# Patient Record
Sex: Female | Born: 1980 | Race: Black or African American | Hispanic: No | Marital: Single | Smoking: Never smoker
Health system: Southern US, Community
[De-identification: ages and names within clinical notes are randomized; demographics above are authoritative.]

## PROBLEM LIST (undated history)

## (undated) DIAGNOSIS — E119 Type 2 diabetes mellitus without complications: Secondary | ICD-10-CM

## (undated) DIAGNOSIS — G43909 Migraine, unspecified, not intractable, without status migrainosus: Secondary | ICD-10-CM

## (undated) DIAGNOSIS — M549 Dorsalgia, unspecified: Secondary | ICD-10-CM

---

## 2015-01-17 ENCOUNTER — Encounter (HOSPITAL_COMMUNITY): Payer: Self-pay | Admitting: Nurse Practitioner

## 2015-01-17 ENCOUNTER — Emergency Department (HOSPITAL_COMMUNITY)
Admission: EM | Admit: 2015-01-17 | Discharge: 2015-01-17 | Disposition: A | Discharge status: Home / Self Care | Payer: Medicaid Other | Attending: Emergency Medicine | Admitting: Emergency Medicine

## 2015-01-17 DIAGNOSIS — R2243 Localized swelling, mass and lump, lower limb, bilateral: Secondary | ICD-10-CM | POA: Insufficient documentation

## 2015-01-17 DIAGNOSIS — M545 Low back pain: Secondary | ICD-10-CM | POA: Diagnosis not present

## 2015-01-17 DIAGNOSIS — G43001 Migraine without aura, not intractable, with status migrainosus: Secondary | ICD-10-CM | POA: Diagnosis not present

## 2015-01-17 DIAGNOSIS — E119 Type 2 diabetes mellitus without complications: Secondary | ICD-10-CM | POA: Insufficient documentation

## 2015-01-17 DIAGNOSIS — G43909 Migraine, unspecified, not intractable, without status migrainosus: Secondary | ICD-10-CM | POA: Diagnosis present

## 2015-01-17 HISTORY — DX: Dorsalgia, unspecified: M54.9

## 2015-01-17 HISTORY — DX: Migraine, unspecified, not intractable, without status migrainosus: G43.909

## 2015-01-17 HISTORY — DX: Type 2 diabetes mellitus without complications: E11.9

## 2015-01-17 LAB — CBG MONITORING, ED: Glucose-Capillary: 167 mg/dL — ABNORMAL HIGH (ref 65–99)

## 2015-01-17 MED ORDER — NAPROXEN 500 MG PO TABS
500.0000 mg | ORAL_TABLET | Freq: Two times a day (BID) | ORAL | Status: AC | PRN
Start: 2015-01-17 — End: ?

## 2015-01-17 MED ORDER — KETOROLAC TROMETHAMINE 30 MG/ML IJ SOLN
30.0000 mg | Freq: Once | INTRAMUSCULAR | Status: AC
  Administered 2015-01-17: 30 mg via INTRAVENOUS
  Filled 2015-01-17: qty 1

## 2015-01-17 MED ORDER — METOCLOPRAMIDE HCL 5 MG/ML IJ SOLN
10.0000 mg | Freq: Once | INTRAMUSCULAR | Status: AC
  Administered 2015-01-17: 10 mg via INTRAVENOUS
  Filled 2015-01-17: qty 2

## 2015-01-17 MED ORDER — SODIUM CHLORIDE 0.9 % IV BOLUS (SEPSIS)
1000.0000 mL | Freq: Once | INTRAVENOUS | Status: AC
  Administered 2015-01-17: 1000 mL via INTRAVENOUS

## 2015-01-17 MED ORDER — DIPHENHYDRAMINE HCL 50 MG/ML IJ SOLN
25.0000 mg | Freq: Once | INTRAMUSCULAR | Status: AC
  Administered 2015-01-17: 25 mg via INTRAVENOUS
  Filled 2015-01-17: qty 1

## 2015-01-17 NOTE — ED Notes (Signed)
Pt requesting CBG check. 

## 2015-01-17 NOTE — ED Notes (Signed)
Pt requesting prescriptions for home, requests "percocet and if I can't have that, then lortab and if I can't have that then tylenol 3 with codeine."

## 2015-01-17 NOTE — ED Notes (Signed)
Pt verbalizes understanding of d/c instructions and denies any further needs at this time. 

## 2015-01-17 NOTE — ED Notes (Signed)
She states "im having many problems." she c/o migraine x 2 days, she took percocet and ibuprofen with no relief. She noticed both feet were swollen 3 days ago, she denies sob/cp/swelling in the rest of her body.  She also c/o lower back pain that is chronic but worse this week. Ambulatory, mae, breathing easily

## 2015-01-17 NOTE — Discharge Instructions (Signed)

## 2015-01-17 NOTE — ED Provider Notes (Signed)
CSN: [id number]     Arrival date & time 01/17/15  1522 History   First MD Initiated Contact with Patient 01/17/15 1548     Chief Complaint  Patient presents with  . Back Pain  . Migraine  . Foot Swelling     (Consider location/radiation/quality/duration/timing/severity/associated sxs/prior Treatment) Patient is a 34 y.o. female presenting with migraines. The history is provided by the patient.  Migraine This is a new problem. The current episode started 12 to 24 hours ago. The problem occurs constantly. The problem has not changed since onset.Nothing aggravates the symptoms. Nothing relieves the symptoms. She has tried nothing for the symptoms. The treatment provided no relief.    Past Medical History  Diagnosis Date  . Back pain   . Diabetes mellitus without complication   . Migraine    History reviewed. No pertinent past surgical history. History reviewed. No pertinent family history. History  Substance Use Topics  . Smoking status: Never Smoker   . Smokeless tobacco: Not on file  . Alcohol Use: No   OB History    No data available     Review of Systems  All other systems reviewed and are negative.     Allergies  Aspirin; Ivp dye; and Tramadol  Home Medications   Prior to Admission medications   Medication Sig Start Date End Date Taking? Authorizing Provider  naproxen (NAPROSYN) 500 MG tablet Take 1 tablet (500 mg total) by mouth 2 (two) times daily as needed for moderate pain. 01/17/15   Lyndal Pulley, MD   BP 130/78 mmHg  Pulse 101  Temp(Src) 97.9 F (36.6 C) (Oral)  Resp 20  SpO2 97% Physical Exam  Constitutional: She is oriented to person, place, and time. She appears well-developed and well-nourished. No distress.  HENT:  Head: Normocephalic.  Eyes: Conjunctivae are normal.  Neck: Neck supple. No tracheal deviation present.  Cardiovascular: Normal rate and regular rhythm.   Pulmonary/Chest: Effort normal. No respiratory distress.  Abdominal: Soft.  She exhibits no distension.  Neurological: She is alert and oriented to person, place, and time. She has normal strength. No cranial nerve deficit. Coordination normal. GCS eye subscore is 4. GCS verbal subscore is 5. GCS motor subscore is 6.  Skin: Skin is warm and dry.  Psychiatric: She has a normal mood and affect.    ED Course  Procedures (including critical care time) Labs Review Labs Reviewed - No data to display  Imaging Review No results found.   EKG Interpretation None      MDM   Final diagnoses:  Migraine without aura and with status migrainosus, not intractable    34 year old feel presents with gradual onset headache starting last night. She has had multiple previous episodes of this that resolved spontaneously. The last one that she had was roughly a month ago which resolved following Percocet and Tylenol. I discussed the dangers of mixing Tylenol containing compounds. She has not taken anything today for her symptoms. She did not sleep well last night and woke up today with a backache that appears to be muscular skeletal in nature. She is also incidentally noticed bilateral foot swelling that is nonpitting and at this time does not appear to be a related complaint. Plan for supportive care measures for the headache. No indication for further emergent workup at this time. Plan to follow up with PCP as needed for routine health maintenance and return precautions discussed for worsening or new concerning symptoms.     Lyndal Pulley, MD 01/17/15  1643 

## 2015-01-18 ENCOUNTER — Emergency Department (HOSPITAL_COMMUNITY): Payer: Medicaid Other

## 2015-01-18 ENCOUNTER — Emergency Department (HOSPITAL_COMMUNITY)
Admission: EM | Admit: 2015-01-18 | Discharge: 2015-01-18 | Disposition: A | Discharge status: Home / Self Care | Payer: Medicaid Other | Attending: Emergency Medicine | Admitting: Emergency Medicine

## 2015-01-18 ENCOUNTER — Encounter (HOSPITAL_COMMUNITY): Payer: Self-pay | Admitting: Emergency Medicine

## 2015-01-18 DIAGNOSIS — R600 Localized edema: Secondary | ICD-10-CM | POA: Diagnosis not present

## 2015-01-18 DIAGNOSIS — R609 Edema, unspecified: Secondary | ICD-10-CM

## 2015-01-18 DIAGNOSIS — H538 Other visual disturbances: Secondary | ICD-10-CM | POA: Diagnosis not present

## 2015-01-18 DIAGNOSIS — E119 Type 2 diabetes mellitus without complications: Secondary | ICD-10-CM | POA: Diagnosis not present

## 2015-01-18 DIAGNOSIS — G43909 Migraine, unspecified, not intractable, without status migrainosus: Secondary | ICD-10-CM | POA: Diagnosis not present

## 2015-01-18 DIAGNOSIS — Z3202 Encounter for pregnancy test, result negative: Secondary | ICD-10-CM | POA: Diagnosis not present

## 2015-01-18 DIAGNOSIS — Z79899 Other long term (current) drug therapy: Secondary | ICD-10-CM | POA: Diagnosis not present

## 2015-01-18 DIAGNOSIS — R2243 Localized swelling, mass and lump, lower limb, bilateral: Secondary | ICD-10-CM | POA: Diagnosis present

## 2015-01-18 LAB — HEPATIC FUNCTION PANEL
ALBUMIN: 4.1 g/dL (ref 3.5–5.0)
ALT: 25 U/L (ref 14–54)
AST: 21 U/L (ref 15–41)
Alkaline Phosphatase: 75 U/L (ref 38–126)
BILIRUBIN TOTAL: 0.1 mg/dL — AB (ref 0.3–1.2)
Bilirubin, Direct: <0.1 mg/dL — ABNORMAL LOW (ref 0.1–0.5)
Total Protein: 7.5 g/dL (ref 6.5–8.1)

## 2015-01-18 LAB — URINALYSIS, ROUTINE W REFLEX MICROSCOPIC
Bilirubin Urine: NEGATIVE
Glucose, UA: NEGATIVE mg/dL
Hgb urine dipstick: NEGATIVE
Ketones, ur: NEGATIVE mg/dL
Nitrite: NEGATIVE
Protein, ur: NEGATIVE mg/dL
Specific Gravity, Urine: 1.033 — ABNORMAL HIGH (ref 1.005–1.030)
Urobilinogen, UA: 0.2 mg/dL (ref 0.0–1.0)
pH: 5.5 (ref 5.0–8.0)

## 2015-01-18 LAB — CBC
HCT: 35.7 % — ABNORMAL LOW (ref 36.0–46.0)
Hemoglobin: 12 g/dL (ref 12.0–15.0)
MCH: 28.3 pg (ref 26.0–34.0)
MCHC: 33.6 g/dL (ref 30.0–36.0)
MCV: 84.2 fL (ref 78.0–100.0)
Platelets: 265 10*3/uL (ref 150–400)
RBC: 4.24 MIL/uL (ref 3.87–5.11)
RDW: 12.4 % (ref 11.5–15.5)
WBC: 7.4 10*3/uL (ref 4.0–10.5)

## 2015-01-18 LAB — BASIC METABOLIC PANEL
ANION GAP: 10 (ref 5–15)
BUN: 9 mg/dL (ref 6–20)
CHLORIDE: 104 mmol/L (ref 101–111)
CO2: 25 mmol/L (ref 22–32)
CREATININE: 0.62 mg/dL (ref 0.44–1.00)
Calcium: 8.9 mg/dL (ref 8.9–10.3)
GFR calc Af Amer: >60 mL/min (ref >60)
GFR calc non Af Amer: >60 mL/min (ref >60)
Glucose, Bld: 191 mg/dL — ABNORMAL HIGH (ref 65–99)
Potassium: 4 mmol/L (ref 3.5–5.1)
Sodium: 139 mmol/L (ref 135–145)

## 2015-01-18 LAB — URINE MICROSCOPIC-ADD ON

## 2015-01-18 LAB — BRAIN NATRIURETIC PEPTIDE: B Natriuretic Peptide: 41.3 pg/mL (ref 0.0–100.0)

## 2015-01-18 LAB — CBG MONITORING, ED: Glucose-Capillary: 218 mg/dL — ABNORMAL HIGH (ref 65–99)

## 2015-01-18 LAB — POC URINE PREG, ED: PREG TEST UR: NEGATIVE

## 2015-01-18 MED ORDER — ACETAMINOPHEN 325 MG PO TABS
650.0000 mg | ORAL_TABLET | Freq: Once | ORAL | Status: AC
  Administered 2015-01-18: 650 mg via ORAL
  Filled 2015-01-18: qty 2

## 2015-01-18 MED ORDER — KETOROLAC TROMETHAMINE 30 MG/ML IJ SOLN
15.0000 mg | Freq: Once | INTRAMUSCULAR | Status: AC
  Administered 2015-01-18: 15 mg via INTRAVENOUS
  Filled 2015-01-18: qty 1

## 2015-01-18 NOTE — ED Provider Notes (Signed)
CSN: [id number]     Arrival date & time 01/18/15  1644 History   First MD Initiated Contact with Patient 01/18/15 1917     Chief Complaint  Patient presents with  . Foot Swelling  . Blood Sugar Problem  . Blurred Vision     (Consider location/radiation/quality/duration/timing/severity/associated sxs/prior Treatment) Patient is a 34 y.o. female presenting with leg pain. The history is provided by the patient.  Leg Pain Location:  Leg Time since incident:  1 week Leg location:  L lower leg and R lower leg Pain details:    Quality:  Aching   Radiates to:  Does not radiate   Severity:  Moderate   Onset quality:  Sudden   Duration:  2 days   Timing:  Constant   Progression:  Worsening Chronicity:  New Dislocation: no   Prior injury to area:  No Relieved by:  Nothing Worsened by:  Bearing weight Ineffective treatments:  None tried Associated symptoms: swelling   Associated symptoms: no decreased ROM, no fatigue and no fever     34 yo F with chief complaints of bilateral lower extremity swelling and pain. Patient states has been going on for the past couple weeks. Has gotten worse over the past 24 hours. Type I diabetic has not checked her blood sugars in the past couple weeks. Patient has fevers chills denies shortness of breath denies orthopnea PND.   Past Medical History  Diagnosis Date  . Back pain   . Diabetes mellitus without complication   . Migraine    History reviewed. No pertinent past surgical history. No family history on file. History  Substance Use Topics  . Smoking status: Never Smoker   . Smokeless tobacco: Not on file  . Alcohol Use: No   OB History    No data available     Review of Systems  Constitutional: Negative for fever, chills and fatigue.  HENT: Negative for congestion and rhinorrhea.   Eyes: Positive for visual disturbance. Negative for redness.  Respiratory: Negative for shortness of breath and wheezing.   Cardiovascular: Negative for  chest pain and palpitations.  Gastrointestinal: Negative for nausea and vomiting.  Genitourinary: Negative for dysuria and urgency.  Musculoskeletal: Negative for myalgias and arthralgias.  Skin: Negative for pallor and wound.  Neurological: Positive for headaches. Negative for dizziness.      Allergies  Aspirin; Ivp dye; Tramadol; and Ibuprofen  Home Medications   Prior to Admission medications   Medication Sig Start Date End Date Taking? Authorizing Provider  acetaminophen (TYLENOL) 500 MG tablet Take 1,000 mg by mouth every 6 (six) hours as needed for mild pain or headache.   Yes Historical Provider, MD  clonazePAM (KLONOPIN) 1 MG tablet Take 1 mg by mouth daily.   Yes Historical Provider, MD  oxyCODONE-acetaminophen (PERCOCET) 7.5-325 MG per tablet Take 1-2 tablets by mouth every 6 (six) hours as needed for moderate pain.   Yes Historical Provider, MD  PARoxetine (PAXIL) 20 MG tablet Take 20 mg by mouth daily.   Yes Historical Provider, MD  zolpidem (AMBIEN) 10 MG tablet Take 10 mg by mouth at bedtime as needed for sleep.   Yes Historical Provider, MD  naproxen (NAPROSYN) 500 MG tablet Take 1 tablet (500 mg total) by mouth 2 (two) times daily as needed for moderate pain. Patient not taking: Reported on 01/18/2015 01/17/15   Lyndal Pulley, MD   BP 132/88 mmHg  Pulse 86  Temp(Src) 98.4 F (36.9 C) (Oral)  Resp 18  Wt 262 lb (118.842 kg)  SpO2 100%  LMP 01/11/2015 Physical Exam  Constitutional: She is oriented to person, place, and time. She appears well-developed and well-nourished. No distress.  HENT:  Head: Normocephalic and atraumatic.  Eyes: EOM are normal. Pupils are equal, round, and reactive to light.  Neck: Normal range of motion. Neck supple.  Cardiovascular: Normal rate and regular rhythm.  Exam reveals no gallop and no friction rub.   No murmur heard. Pulmonary/Chest: Effort normal. She has no wheezes. She has no rales.  Abdominal: Soft. She exhibits no distension.  There is no tenderness. There is no rebound and no guarding.  Musculoskeletal: She exhibits edema (1+ to the shins). She exhibits no tenderness.  Neurological: She is alert and oriented to person, place, and time.  Skin: Skin is warm and dry. She is not diaphoretic.  Psychiatric: She has a normal mood and affect. Her behavior is normal.    ED Course  Procedures (including critical care time) Labs Review Labs Reviewed  BASIC METABOLIC PANEL - Abnormal; Notable for the following:    Glucose, Bld 191 (*)    All other components within normal limits  CBC - Abnormal; Notable for the following:    HCT 35.7 (*)    All other components within normal limits  URINALYSIS, ROUTINE W REFLEX MICROSCOPIC (NOT AT [facility]) - Abnormal; Notable for the following:    Color, Urine AMBER (*)    APPearance CLOUDY (*)    Specific Gravity, Urine 1.033 (*)    Leukocytes, UA SMALL (*)    All other components within normal limits  HEPATIC FUNCTION PANEL - Abnormal; Notable for the following:    Total Bilirubin 0.1 (*)    Bilirubin, Direct <0.1 (*)    All other components within normal limits  URINE MICROSCOPIC-ADD ON - Abnormal; Notable for the following:    Squamous Epithelial / LPF MANY (*)    All other components within normal limits  CBG MONITORING, ED - Abnormal; Notable for the following:    Glucose-Capillary 218 (*)    All other components within normal limits  BRAIN NATRIURETIC PEPTIDE  CBG MONITORING, ED  POC URINE PREG, ED    Imaging Review Dg Chest 2 View  01/18/2015   CLINICAL DATA:  Blurred vision and headaches for 2 days.  EXAM: CHEST  2 VIEW  COMPARISON:  None.  FINDINGS: The heart size and mediastinal contours are within normal limits. Both lungs are clear. The visualized skeletal structures are unremarkable.  IMPRESSION: Normal chest x-ray.   Electronically Signed   By: Rudie Meyer M.D.   On: 01/18/2015 19:58     EKG Interpretation None      MDM   Final diagnoses:  Peripheral  edema    34 yo F well-appearing and nontoxic. Patient with mild swelling to her lower extremity's. Will check kidney liver function as well as BNP. Obtain chest x-ray  Chest x-ray unremarkable BMP unremarkable. Liver and renal function appeared normal. Discussed results with patient will have her follow-up with her PCP. Patient requesting narcotic pain medicine for her leg pain discussed risks and benefits patient agrees to try Motrin naproxen follow-up with [facility].  The patients results and plan were reviewed and discussed.   Any x-rays performed were independently reviewed by myself.   Differential diagnosis were considered with the presenting HPI.  Medications  ketorolac (TORADOL) 30 MG/ML injection 15 mg (15 mg Intravenous Given 01/18/15 1956)  acetaminophen (TYLENOL) tablet 650 mg (650 mg Oral  Given 01/18/15 2118)    Filed Vitals:   01/18/15 1649 01/18/15 1709 01/18/15 2002  BP: 121/76  132/88  Pulse: 88  86  Temp: 98.4 F (36.9 C)    TempSrc: Oral    Resp: 18  18  Weight: 262 lb (118.842 kg)    SpO2: 98% 100% 100%    Final diagnoses:  Peripheral edema    Admission/ observation were discussed with the admitting physician, patient and/or family and they are comfortable with the plan.    Melene Plan, DO 01/18/15 2345

## 2015-01-18 NOTE — Discharge Instructions (Signed)
Peripheral Edema °You have swelling in your legs (peripheral edema). This swelling is due to excess accumulation of salt and water in your body. Edema may be a sign of heart, kidney or liver disease, or a side effect of a medication. It may also be due to problems in the leg veins. Elevating your legs and using special support stockings may be very helpful, if the cause of the swelling is due to poor venous circulation. Avoid long periods of standing, whatever the cause. °Treatment of edema depends on identifying the cause. Chips, pretzels, pickles and other salty foods should be avoided. Restricting salt in your diet is almost always needed. Water pills (diuretics) are often used to remove the excess salt and water from your body via urine. These medicines prevent the kidney from reabsorbing sodium. This increases urine flow. °Diuretic treatment may also result in lowering of potassium levels in your body. Potassium supplements may be needed if you have to use diuretics daily. Daily weights can help you keep track of your progress in clearing your edema. You should call your caregiver for follow up care as recommended. °SEEK IMMEDIATE MEDICAL CARE IF:  °· You have increased swelling, pain, redness, or heat in your legs. °· You develop shortness of breath, especially when lying down. °· You develop chest or abdominal pain, weakness, or fainting. °· You have a fever. °Document Released: 07/12/2004 Document Revised: 08/27/2011 Document Reviewed: 06/22/2009 °[employer]® Patient Information ©2015 [employer]. This information is not intended to replace advice given to you by your health care provider. Make sure you discuss any questions you have with your health care provider. ° °

## 2015-01-18 NOTE — ED Notes (Addendum)
Patient states she has blurred vision  and headache x 2 days.  She states it hurts when she walks.  Patient states she is a diabetic but doesn't check her glucose.  No LOC.  She has some nausea but no vomiting.   Headache is a pounding pressure.  Patient stated her bilaterally feet swollen has been ongoing for a couple of weeks.

## 2015-01-26 ENCOUNTER — Emergency Department (HOSPITAL_COMMUNITY)
Admission: EM | Admit: 2015-01-26 | Discharge: 2015-01-26 | Disposition: A | Discharge status: Home / Self Care | Payer: Medicaid Other | Attending: Emergency Medicine | Admitting: Emergency Medicine

## 2015-01-26 ENCOUNTER — Emergency Department (HOSPITAL_COMMUNITY): Payer: Medicaid Other

## 2015-01-26 ENCOUNTER — Encounter (HOSPITAL_COMMUNITY): Payer: Self-pay | Admitting: Emergency Medicine

## 2015-01-26 DIAGNOSIS — E1165 Type 2 diabetes mellitus with hyperglycemia: Secondary | ICD-10-CM | POA: Diagnosis not present

## 2015-01-26 DIAGNOSIS — G43909 Migraine, unspecified, not intractable, without status migrainosus: Secondary | ICD-10-CM | POA: Diagnosis not present

## 2015-01-26 DIAGNOSIS — Z3202 Encounter for pregnancy test, result negative: Secondary | ICD-10-CM | POA: Insufficient documentation

## 2015-01-26 DIAGNOSIS — M545 Low back pain: Secondary | ICD-10-CM

## 2015-01-26 DIAGNOSIS — Z79899 Other long term (current) drug therapy: Secondary | ICD-10-CM | POA: Diagnosis not present

## 2015-01-26 DIAGNOSIS — Y9389 Activity, other specified: Secondary | ICD-10-CM | POA: Insufficient documentation

## 2015-01-26 DIAGNOSIS — Y92410 Unspecified street and highway as the place of occurrence of the external cause: Secondary | ICD-10-CM | POA: Insufficient documentation

## 2015-01-26 DIAGNOSIS — S3992XA Unspecified injury of lower back, initial encounter: Secondary | ICD-10-CM | POA: Diagnosis not present

## 2015-01-26 DIAGNOSIS — Y998 Other external cause status: Secondary | ICD-10-CM | POA: Diagnosis not present

## 2015-01-26 LAB — RAPID URINE DRUG SCREEN, HOSP PERFORMED
AMPHETAMINES: NOT DETECTED
BARBITURATES: NOT DETECTED
BENZODIAZEPINES: NOT DETECTED
COCAINE: NOT DETECTED
OPIATES: NOT DETECTED
Tetrahydrocannabinol: NOT DETECTED

## 2015-01-26 LAB — CBG MONITORING, ED: Glucose-Capillary: 228 mg/dL — ABNORMAL HIGH (ref 65–99)

## 2015-01-26 LAB — URINALYSIS, ROUTINE W REFLEX MICROSCOPIC
BILIRUBIN URINE: NEGATIVE
Glucose, UA: NEGATIVE mg/dL
HGB URINE DIPSTICK: NEGATIVE
Ketones, ur: NEGATIVE mg/dL
Nitrite: NEGATIVE
Protein, ur: 30 mg/dL — AB
SPECIFIC GRAVITY, URINE: 1.024 (ref 1.005–1.030)
Urobilinogen, UA: 1 mg/dL (ref 0.0–1.0)
pH: 5 (ref 5.0–8.0)

## 2015-01-26 LAB — POC URINE PREG, ED: PREG TEST UR: NEGATIVE

## 2015-01-26 LAB — I-STAT CHEM 8, ED
BUN: 10 mg/dL (ref 6–20)
CREATININE: 0.8 mg/dL (ref 0.44–1.00)
Calcium, Ion: 1.18 mmol/L (ref 1.12–1.23)
Chloride: 99 mmol/L — ABNORMAL LOW (ref 101–111)
Glucose, Bld: 158 mg/dL — ABNORMAL HIGH (ref 65–99)
HEMATOCRIT: 37 % (ref 36.0–46.0)
Hemoglobin: 12.6 g/dL (ref 12.0–15.0)
POTASSIUM: 3.9 mmol/L (ref 3.5–5.1)
SODIUM: 136 mmol/L (ref 135–145)
TCO2: 24 mmol/L (ref 0–100)

## 2015-01-26 LAB — URINE MICROSCOPIC-ADD ON

## 2015-01-26 MED ORDER — OXYCODONE-ACETAMINOPHEN 5-325 MG PO TABS
2.0000 | ORAL_TABLET | Freq: Once | ORAL | Status: AC
  Administered 2015-01-26: 2 via ORAL
  Filled 2015-01-26: qty 2

## 2015-01-26 MED ORDER — OXYCODONE-ACETAMINOPHEN 5-325 MG PO TABS: 2.0000 | ORAL_TABLET | Freq: Once | ORAL | Status: AC

## 2015-01-26 MED ORDER — CYCLOBENZAPRINE HCL 10 MG PO TABS: 10.0000 mg | ORAL_TABLET | Freq: Three times a day (TID) | ORAL | Status: AC | PRN

## 2015-01-26 NOTE — Discharge Instructions (Signed)

## 2015-01-26 NOTE — ED Notes (Signed)
Pt returned from scans. Monitored by pulse ox and bp cuff. 

## 2015-01-26 NOTE — ED Notes (Signed)
MD made aware pt is requesting pain medication.  
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
## 2015-01-26 NOTE — ED Notes (Signed)
Pt. Stated, Im out of all my Insulin, I just moved here.
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
## 2015-02-07 ENCOUNTER — Ambulatory Visit: Payer: Medicaid Other | Admitting: Family Medicine

## 2016-12-09 IMAGING — CR DG CHEST 2V
2 series · 2 of 2 positions shown · non-contrast
Comparison: None.

CLINICAL DATA: Blurred vision and headaches for 2 days.

EXAM:
CHEST  2 VIEW

[w chest pa]
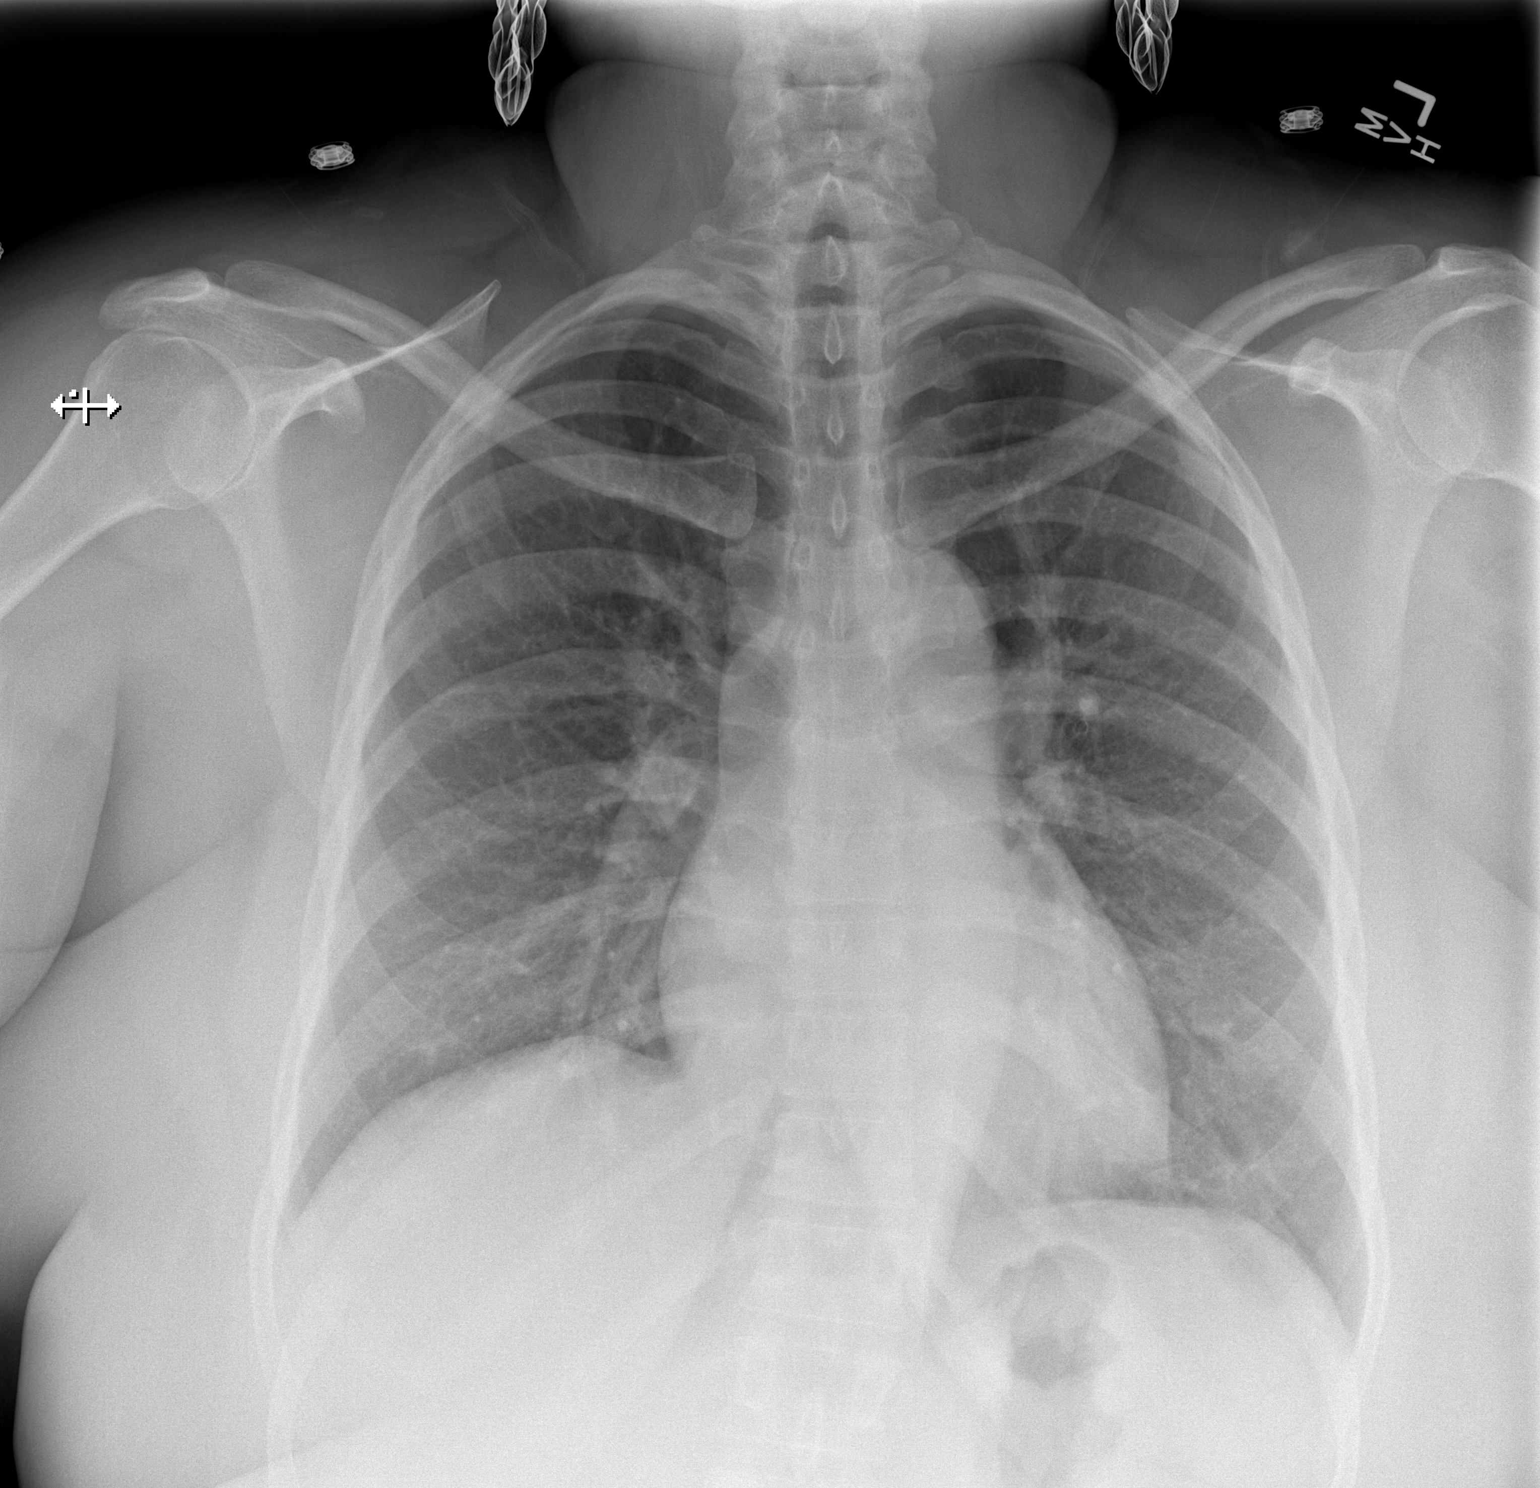

[w chest lat]
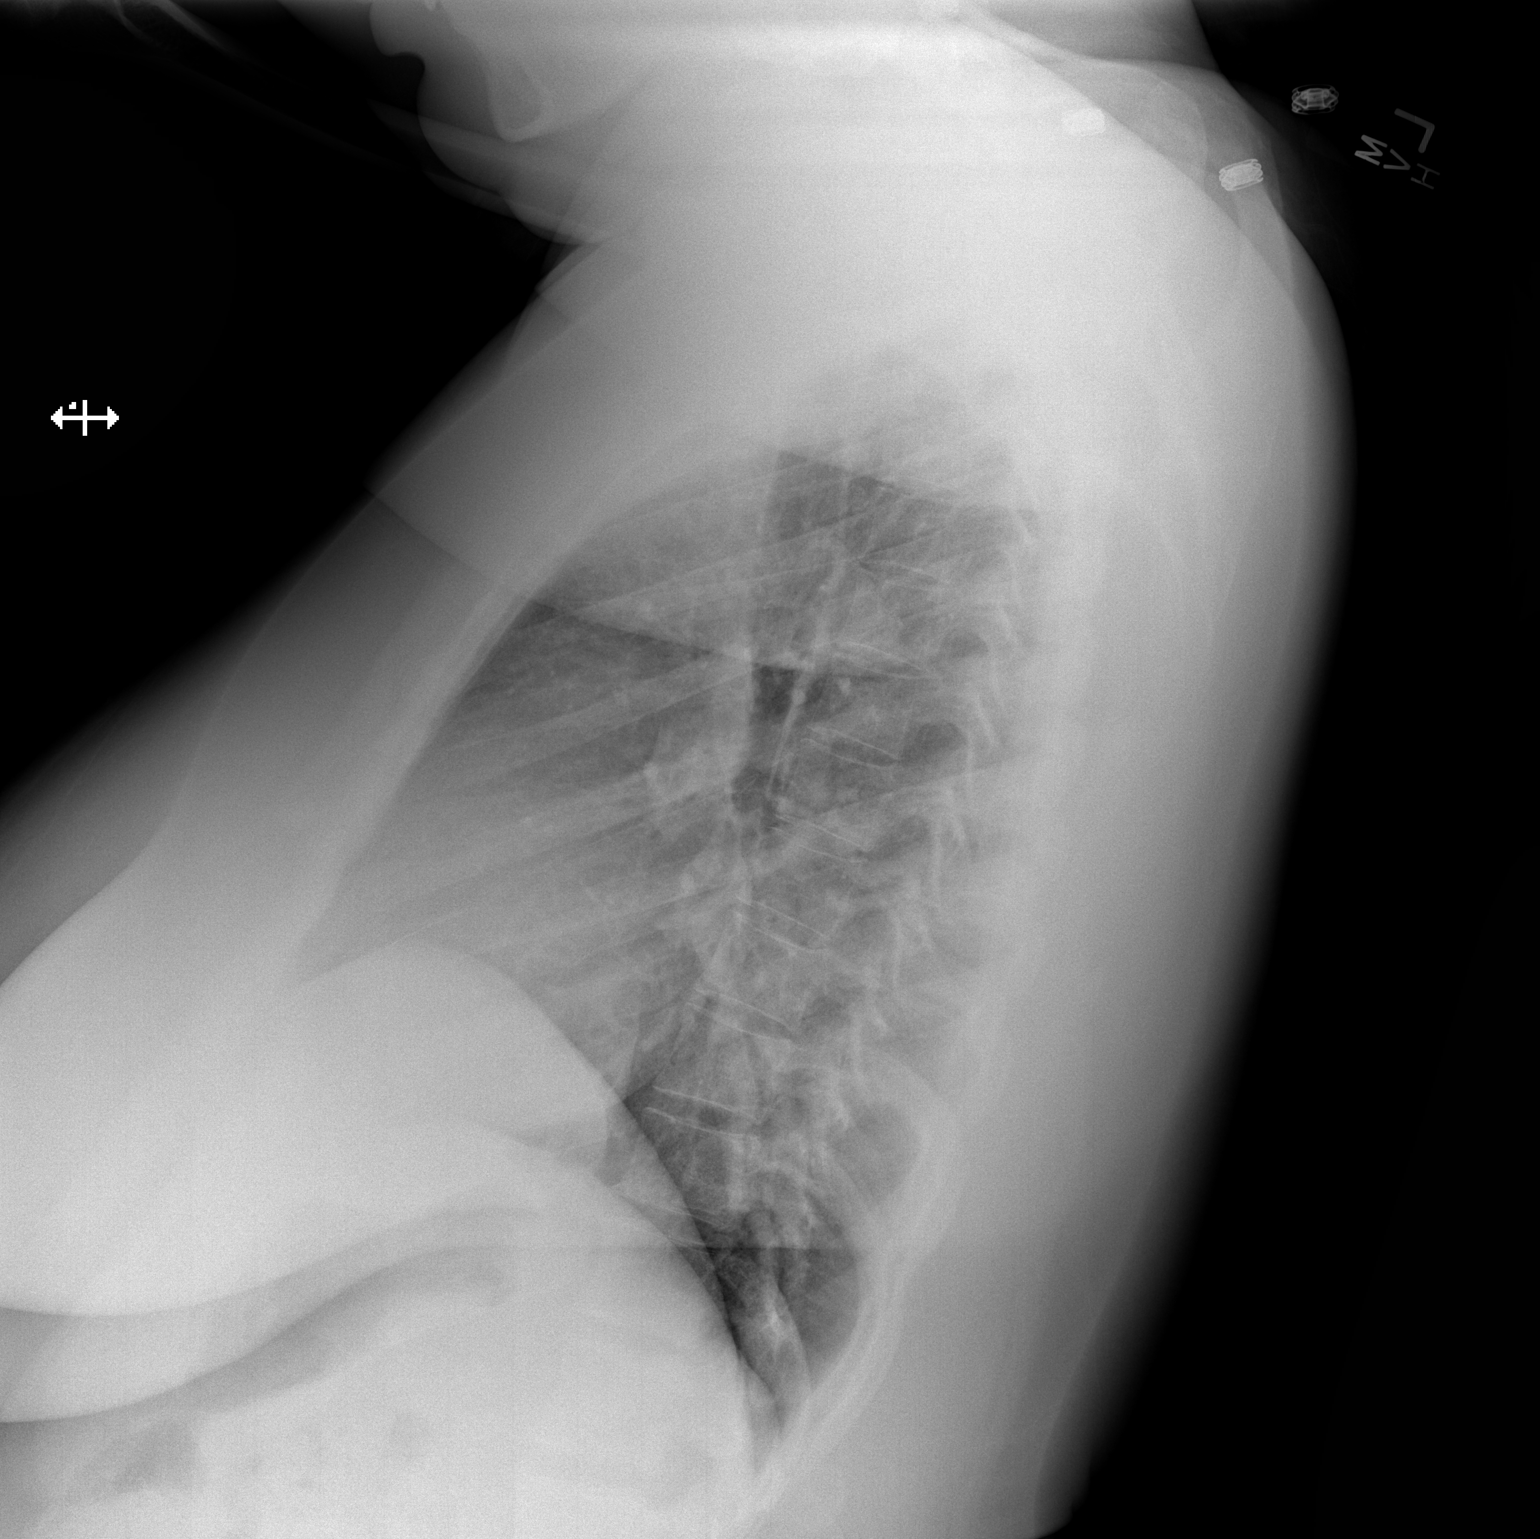

[2 of 2 positions shown; findings below may reference images not displayed]

FINDINGS: The heart size and mediastinal contours are within normal limits.
Both lungs are clear. The visualized skeletal structures are
unremarkable.
IMPRESSION: Normal chest x-ray.
